# Patient Record
Sex: Female | Born: 1955 | Race: White | Hispanic: No | Marital: Single | Smoking: Never smoker
Health system: Southern US, Community
[De-identification: ages and names within clinical notes are randomized; demographics above are authoritative.]

## PROBLEM LIST (undated history)

## (undated) DIAGNOSIS — E119 Type 2 diabetes mellitus without complications: Secondary | ICD-10-CM

---

## 2019-08-19 ENCOUNTER — Other Ambulatory Visit: Payer: Self-pay | Admitting: Physician Assistant

## 2019-08-19 DIAGNOSIS — Z1231 Encounter for screening mammogram for malignant neoplasm of breast: Secondary | ICD-10-CM

## 2019-10-06 ENCOUNTER — Other Ambulatory Visit: Payer: Self-pay

## 2019-10-06 ENCOUNTER — Emergency Department (HOSPITAL_COMMUNITY)
Admission: EM | Admit: 2019-10-06 | Discharge: 2019-10-08 | Disposition: A | Discharge status: Other Acute Inpatient Hospital | Payer: Medicare Other | Attending: Emergency Medicine | Admitting: Emergency Medicine

## 2019-10-06 ENCOUNTER — Encounter (HOSPITAL_COMMUNITY): Payer: Self-pay

## 2019-10-06 DIAGNOSIS — F329 Major depressive disorder, single episode, unspecified: Secondary | ICD-10-CM | POA: Insufficient documentation

## 2019-10-06 DIAGNOSIS — E876 Hypokalemia: Secondary | ICD-10-CM | POA: Diagnosis not present

## 2019-10-06 DIAGNOSIS — T383X2A Poisoning by insulin and oral hypoglycemic [antidiabetic] drugs, intentional self-harm, initial encounter: Secondary | ICD-10-CM | POA: Diagnosis not present

## 2019-10-06 DIAGNOSIS — Z794 Long term (current) use of insulin: Secondary | ICD-10-CM | POA: Insufficient documentation

## 2019-10-06 DIAGNOSIS — Z599 Problem related to housing and economic circumstances, unspecified: Secondary | ICD-10-CM

## 2019-10-06 DIAGNOSIS — Z20828 Contact with and (suspected) exposure to other viral communicable diseases: Secondary | ICD-10-CM | POA: Insufficient documentation

## 2019-10-06 DIAGNOSIS — E162 Hypoglycemia, unspecified: Secondary | ICD-10-CM | POA: Diagnosis present

## 2019-10-06 DIAGNOSIS — E119 Type 2 diabetes mellitus without complications: Secondary | ICD-10-CM | POA: Insufficient documentation

## 2019-10-06 HISTORY — DX: Type 2 diabetes mellitus without complications: E11.9

## 2019-10-06 LAB — URINALYSIS, COMPLETE (UACMP) WITH MICROSCOPIC
Bilirubin Urine: NEGATIVE
Glucose, UA: NEGATIVE mg/dL
Hgb urine dipstick: NEGATIVE
Ketones, ur: NEGATIVE mg/dL
Leukocytes,Ua: NEGATIVE
Nitrite: NEGATIVE
Protein, ur: NEGATIVE mg/dL
Specific Gravity, Urine: 1.011 (ref 1.005–1.030)
pH: 5 (ref 5.0–8.0)

## 2019-10-06 LAB — CBG MONITORING, ED
Glucose-Capillary: 153 mg/dL — ABNORMAL HIGH (ref 70–99)
Glucose-Capillary: 130 mg/dL — ABNORMAL HIGH (ref 70–99)
Glucose-Capillary: 111 mg/dL — ABNORMAL HIGH (ref 70–99)
Glucose-Capillary: 132 mg/dL — ABNORMAL HIGH (ref 70–99)
Glucose-Capillary: 159 mg/dL — ABNORMAL HIGH (ref 70–99)

## 2019-10-06 LAB — BASIC METABOLIC PANEL
Anion gap: 10 (ref 5–15)
BUN: 13 mg/dL (ref 8–23)
CO2: 32 mmol/L (ref 22–32)
Calcium: 9.3 mg/dL (ref 8.9–10.3)
Chloride: 100 mmol/L (ref 98–111)
Creatinine, Ser: 0.74 mg/dL (ref 0.44–1.00)
GFR calc Af Amer: >60 mL/min (ref >60)
GFR calc non Af Amer: >60 mL/min (ref >60)
Glucose, Bld: 104 mg/dL — ABNORMAL HIGH (ref 70–99)
Potassium: 3.1 mmol/L — ABNORMAL LOW (ref 3.5–5.1)
Sodium: 142 mmol/L (ref 135–145)

## 2019-10-06 LAB — COMPREHENSIVE METABOLIC PANEL
ALT: 15 U/L (ref 0–44)
AST: 19 U/L (ref 15–41)
Albumin: 3.1 g/dL — ABNORMAL LOW (ref 3.5–5.0)
Alkaline Phosphatase: 95 U/L (ref 38–126)
Anion gap: 8 (ref 5–15)
BUN: 12 mg/dL (ref 8–23)
CO2: 28 mmol/L (ref 22–32)
Calcium: 7.8 mg/dL — ABNORMAL LOW (ref 8.9–10.3)
Chloride: 104 mmol/L (ref 98–111)
Creatinine, Ser: 0.58 mg/dL (ref 0.44–1.00)
GFR calc Af Amer: >60 mL/min (ref >60)
GFR calc non Af Amer: >60 mL/min (ref >60)
Glucose, Bld: 204 mg/dL — ABNORMAL HIGH (ref 70–99)
Potassium: 2.2 mmol/L — CL (ref 3.5–5.1)
Sodium: 140 mmol/L (ref 135–145)
Total Bilirubin: 0.3 mg/dL (ref 0.3–1.2)
Total Protein: 5.9 g/dL — ABNORMAL LOW (ref 6.5–8.1)

## 2019-10-06 LAB — CBC
HCT: 42.2 % (ref 36.0–46.0)
Hemoglobin: 13.4 g/dL (ref 12.0–15.0)
MCH: 29.6 pg (ref 26.0–34.0)
MCHC: 31.8 g/dL (ref 30.0–36.0)
MCV: 93.2 fL (ref 80.0–100.0)
Platelets: 186 10*3/uL (ref 150–400)
RBC: 4.53 MIL/uL (ref 3.87–5.11)
RDW: 14.7 % (ref 11.5–15.5)
WBC: 13.4 10*3/uL — ABNORMAL HIGH (ref 4.0–10.5)
nRBC: 0 % (ref 0.0–0.2)

## 2019-10-06 LAB — ETHANOL: Alcohol, Ethyl (B): <10 mg/dL (ref <10)

## 2019-10-06 LAB — SALICYLATE LEVEL: Salicylate Lvl: <7 mg/dL (ref 2.8–30.0)

## 2019-10-06 LAB — SARS CORONAVIRUS 2 (TAT 6-24 HRS): SARS Coronavirus 2: NEGATIVE

## 2019-10-06 LAB — ACETAMINOPHEN LEVEL: Acetaminophen (Tylenol), Serum: <10 ug/mL — ABNORMAL LOW (ref 10–30)

## 2019-10-06 LAB — MAGNESIUM: Magnesium: 2.1 mg/dL (ref 1.7–2.4)

## 2019-10-06 MED ORDER — SODIUM CHLORIDE 0.9 % IV SOLN
INTRAVENOUS | Status: DC | PRN
  Administered 2019-10-06: 18:00:00 500 mL via INTRAVENOUS

## 2019-10-06 MED ORDER — SODIUM CHLORIDE 0.9 % IV SOLN: 250.0000 mL | INTRAVENOUS | Status: DC | PRN

## 2019-10-06 MED ORDER — SODIUM CHLORIDE 0.9% FLUSH: 3.0000 mL | INTRAVENOUS | Status: DC | PRN

## 2019-10-06 MED ORDER — POTASSIUM CHLORIDE CRYS ER 20 MEQ PO TBCR
40.0000 meq | EXTENDED_RELEASE_TABLET | Freq: Once | ORAL | Status: AC
  Administered 2019-10-06: 40 meq via ORAL
  Filled 2019-10-06: qty 2

## 2019-10-06 MED ORDER — POTASSIUM CHLORIDE 10 MEQ/100ML IV SOLN
10.0000 meq | INTRAVENOUS | Status: DC
  Administered 2019-10-06: 10 meq via INTRAVENOUS
  Filled 2019-10-06: qty 100

## 2019-10-06 MED ORDER — SODIUM CHLORIDE 0.9% FLUSH: 3.0000 mL | Freq: Two times a day (BID) | INTRAVENOUS | Status: DC

## 2019-10-06 MED ORDER — KETOROLAC TROMETHAMINE 60 MG/2ML IM SOLN
60.0000 mg | Freq: Once | INTRAMUSCULAR | Status: AC
  Administered 2019-10-06: 60 mg via INTRAMUSCULAR
  Filled 2019-10-06: qty 2

## 2019-10-06 MED ORDER — ACETAMINOPHEN 325 MG PO TABS
650.0000 mg | ORAL_TABLET | ORAL | Status: DC | PRN
  Administered 2019-10-06: 650 mg via ORAL
  Filled 2019-10-06: qty 2

## 2019-10-06 NOTE — BH Assessment (Addendum)
Tele Assessment Note   Patient Name: Elizabeth Holt MRN: [id number] Referring Physician: Dr. Pricilla Loveless Location of Patient: [place] Location of Provider: Behavioral Health TTS Department  Elizabeth Holt is an 63 y.o. female presenting with SI with attempted overdose on insulin. EMS reports from home, roommate called for intentional excessive use of insulin, Patient states she took 10units, 10units and 15units of Humolog between 1000 and 1200 today. Patient also states she took it intentionally due to stress in the living environment of her home, especially a 42 year old roommate who is supposed to be helping her but instead plays extremely loud music and doesn't answer her at times. Patient also reported stressors related to her medical condition of always needing help. Patient states that due to these noises she has been feeling worse. Patient was unable to recall if she received any psychiatric medication management treatment. Patient denied prior suicide attempts and self-harming behaviors. Patient denies HI, psychosis and alcohol/drug usage.   Patient resides with son-in-law and another roommate of the son-in-law. Patient reported having a son that lives in [place] and a daughter that lives in [place]. Patient is receiving disability. Patient was pleasant and cooperative during assessment.  Collateral Contact: Elizabeth Holt, son-in-law, [phone], was present during assessment. Elizabeth Holt was unaware of patient SI. Elizabeth Holt reported patient can be inpatient at times. Elizabeth Holt felt that additional inpatient treatment may be needed for patient. Elizabeth Holt shared no other concerns at this time.  Diagnosis: Major depression disorder  Past Medical History:  Past Medical History:  Diagnosis Date  . Diabetes mellitus without complication (HCC)     History reviewed. No pertinent surgical history.  Family History: History reviewed. No pertinent family history.  Social History:  reports that she has never  smoked. She has never used smokeless tobacco. No history on file for alcohol and drug.  Additional Social History:  Alcohol / Drug Use Pain Medications: see MAR Prescriptions: see MAR Over the Counter: see MAR  CIWA: CIWA-Ar BP: 122/72 Pulse Rate: 87 COWS:    Allergies:  Allergies  Allergen Reactions  . Lyrica [Pregabalin]   . Robaxin [Methocarbamol]     Home Medications: (Not in a hospital admission)   OB/GYN Status:  No LMP recorded. Patient is postmenopausal.  General Assessment Data Location of Assessment: [facility] TTS Assessment: In system Is this a Tele or Face-to-Face Assessment?: Tele Assessment Is this an Initial Assessment or a Re-assessment for this encounter?: Initial Assessment Patient Accompanied by:: Other(son in law) Language Other than English: No Living Arrangements: (family home) What gender do you identify as?: Female Marital status: Single Living Arrangements: Other relatives(son in law) Can pt return to current living arrangement?: Yes Admission Status: Voluntary Is patient capable of signing voluntary admission?: Yes Referral Source: Self/Family/Friend     Crisis Care Plan Living Arrangements: Other relatives(son in law) Legal Guardian: (self) Name of Psychiatrist: (none) Name of Therapist: (none)  Education Status Is patient currently in school?: No Is the patient employed, unemployed or receiving disability?: Receiving disability income  Risk to self with the past 6 months Suicidal Ideation: Yes-Currently Present Has patient been a risk to self within the past 6 months prior to admission? : Yes Suicidal Intent: Yes-Currently Present Has patient had any suicidal intent within the past 6 months prior to admission? : Yes Is patient at risk for suicide?: Yes Suicidal Plan?: Yes-Currently Present Has patient had any suicidal plan within the past 6 months prior to admission? : Yes Specify Current Suicidal Plan: (attempted overdose on  insulin) Access to Means: Yes Specify Access to Suicidal Means: (insulin user) What has been your use of drugs/alcohol within the last 12 months?: (none) Previous Attempts/Gestures: No How many times?: (0) Other Self Harm Risks: (none reported) Triggers for Past Attempts: (n/a) Intentional Self Injurious Behavior: None Family Suicide History: No Recent stressful life event(s): (diabetes) Persecutory voices/beliefs?: No Depression: Yes Depression Symptoms: Feeling worthless/self pity, Loss of interest in usual pleasures, Guilt, Fatigue, Isolating, Tearfulness, Feeling angry/irritable Substance abuse history and/or treatment for substance abuse?: No Suicide prevention information given to non-admitted patients: Not applicable  Risk to Others within the past 6 months Homicidal Ideation: No Does patient have any lifetime risk of violence toward others beyond the six months prior to admission? : No Thoughts of Harm to Others: No Current Homicidal Intent: No Current Homicidal Plan: No Access to Homicidal Means: No Identified Victim: (n/a) History of harm to others?: No Assessment of Violence: None Noted Violent Behavior Description: (none reported) Does patient have access to weapons?: No Criminal Charges Pending?: No Does patient have a court date: No Is patient on probation?: No  Psychosis Hallucinations: None noted Delusions: None noted  Mental Status Report Appearance/Hygiene: Unremarkable Eye Contact: Fair Motor Activity: Freedom of movement Speech: Logical/coherent Level of Consciousness: Alert Mood: Depressed Affect: Depressed, Appropriate to circumstance Anxiety Level: Moderate Thought Processes: Coherent, Relevant Judgement: Partial Orientation: Person, Place, Time, Situation Obsessive Compulsive Thoughts/Behaviors: None  Cognitive Functioning Concentration: Good Memory: Recent Intact Is patient IDD: No Insight: Poor Impulse Control: Poor Appetite:  Fair Have you had any weight changes? : No Change Sleep: No Change Total Hours of Sleep: (7-9 hours) Vegetative Symptoms: None  ADLScreening [facility] Assessment Services) Patient's cognitive ability adequate to safely complete daily activities?: Yes Patient able to express need for assistance with ADLs?: Yes Independently performs ADLs?: Yes (appropriate for developmental age)  Prior Inpatient Therapy Prior Inpatient Therapy: No  Prior Outpatient Therapy Prior Outpatient Therapy: No Does patient have an ACCT team?: No Does patient have Intensive In-House Services?  : No Does patient have [facility] services? : No Does patient have P4CC services?: No  ADL Screening (condition at time of admission) Patient's cognitive ability adequate to safely complete daily activities?: Yes Patient able to express need for assistance with ADLs?: Yes Independently performs ADLs?: Yes (appropriate for developmental age)  Regulatory affairs officer (For Healthcare) Does Patient Have a Medical Advance Directive?: No Would patient like information on creating a medical advance directive?: No - Patient declined   Disposition:  Disposition Initial Assessment Completed for this Encounter: Yes  Renetta Chalk, NP, patient meets inpatient criteria. TTS to secure placement. Inpatient geriatric psychiatry recommended.  This service was provided via telemedicine using a 2-way, interactive audio and video technology.  Names of all persons participating in this telemedicine service and their role in this encounter. Name: Elizabeth Holt Role: Patient  Name: Kirtland Bouchard Role: TTS Clinician  Name:  Role:   Name:  Role:     Venora Maples 10/06/2019 8:46 PM

## 2019-10-06 NOTE — ED Notes (Signed)
Pt removed her IV. When asked why she removed it, pt stated "I didn't want it anymore".

## 2019-10-06 NOTE — ED Notes (Signed)
Pt provided 16oz orange juice and sandwich

## 2019-10-06 NOTE — ED Notes (Signed)
Pt given turkey sandwich and orange juice

## 2019-10-06 NOTE — Care Management (Signed)
Under review [place], [facility], [place], [facility], [place], [facility], [facility], [facility], [facility], [facility], [place], [facility] [place], [place], [place], [place], [place], [place], [facility], [facility], [facility], [facility], [facility], [facility], [facility], [facility], [facility], [facility], [facility], [place], [place], [place], [place], [place], [facility], [place], [place] 

## 2019-10-06 NOTE — ED Provider Notes (Signed)
[facility]-EMERGENCY DEPT Provider Note   CSN: [id number] Arrival date & time: 10/06/19  1526     History   Chief Complaint Chief Complaint  Patient presents with  . Ingestion  . Hypoglycemia    HPI Elizabeth Holt is a 63 y.o. female.     HPI  63 year old female presents with intentional insulin between 10 AM and 12 PM she took 10 units, 10 units, and 15 units of Humalog.  Roommate called EMS and her initial CBG was 60 and was given oral glucagon and D10.  She states that she is had a lot of stressors in her house, especially a 61 year old who is supposed to be helping her but instead place extremely loud music.  Patient states that due to these noise she has been feeling worse.  She denies any acute medical complaints otherwise.  She denies feeling suicidal now but does acknowledge she is depressed.  Past Medical History:  Diagnosis Date  . Diabetes mellitus without complication (HCC)     There are no active problems to display for this patient.   History reviewed. No pertinent surgical history.   OB History   No obstetric history on file.      Home Medications    Prior to Admission medications   Not on File    Family History History reviewed. No pertinent family history.  Social History Social History   Tobacco Use  . Smoking status: Never Smoker  . Smokeless tobacco: Never Used  Substance Use Topics  . Alcohol use: Not on file  . Drug use: Not on file     Allergies   Lyrica [pregabalin] and Robaxin [methocarbamol]   Review of Systems Review of Systems  Constitutional: Negative for fever.  Respiratory: Negative for shortness of breath.   Cardiovascular: Negative for chest pain.  Gastrointestinal: Negative for abdominal pain and vomiting.  Neurological: Negative for headaches.  Psychiatric/Behavioral: Positive for dysphoric mood. Negative for hallucinations.  All other systems reviewed and are negative.    Physical  Exam Updated Vital Signs BP (!) 123/58   Pulse 76   Temp 97.9 F (36.6 C) (Oral)   Resp 19   Ht 5\' 2"  (1.575 m)   Wt 64.4 kg   SpO2 95%   BMI 25.97 kg/m   Physical Exam Vitals signs and nursing note reviewed.  Constitutional:      General: She is not in acute distress.    Appearance: She is well-developed. She is not ill-appearing or diaphoretic.  HENT:     Head: Normocephalic and atraumatic.     Right Ear: External ear normal.     Left Ear: External ear normal.     Nose: Nose normal.  Eyes:     General:        Right eye: No discharge.        Left eye: No discharge.  Cardiovascular:     Rate and Rhythm: Normal rate and regular rhythm.     Heart sounds: Normal heart sounds.  Pulmonary:     Effort: Pulmonary effort is normal.     Breath sounds: Normal breath sounds.  Abdominal:     Palpations: Abdomen is soft.     Tenderness: There is no abdominal tenderness.  Skin:    General: Skin is warm and dry.  Neurological:     Mental Status: She is alert.  Psychiatric:        Mood and Affect: Mood is depressed. Mood is not anxious.  Thought Content: Thought content does not include suicidal ideation.      ED Treatments / Results  Labs (all labs ordered are listed, but only abnormal results are displayed) Labs Reviewed  CBC - Abnormal; Notable for the following components:      Result Value   WBC 13.4 (*)    All other components within normal limits  URINALYSIS, COMPLETE (UACMP) WITH MICROSCOPIC - Abnormal; Notable for the following components:   Bacteria, UA RARE (*)    All other components within normal limits  ACETAMINOPHEN LEVEL - Abnormal; Notable for the following components:   Acetaminophen (Tylenol), Serum <10 (*)    All other components within normal limits  COMPREHENSIVE METABOLIC PANEL - Abnormal; Notable for the following components:   Potassium 2.2 (*)    Glucose, Bld 204 (*)    Calcium 7.8 (*)    Total Protein 5.9 (*)    Albumin 3.1 (*)     All other components within normal limits  BASIC METABOLIC PANEL - Abnormal; Notable for the following components:   Potassium 3.1 (*)    Glucose, Bld 104 (*)    All other components within normal limits  CBG MONITORING, ED - Abnormal; Notable for the following components:   Glucose-Capillary 153 (*)    All other components within normal limits  CBG MONITORING, ED - Abnormal; Notable for the following components:   Glucose-Capillary 130 (*)    All other components within normal limits  CBG MONITORING, ED - Abnormal; Notable for the following components:   Glucose-Capillary 111 (*)    All other components within normal limits  CBG MONITORING, ED - Abnormal; Notable for the following components:   Glucose-Capillary 132 (*)    All other components within normal limits  SARS CORONAVIRUS 2 (TAT 6-24 HRS)  ETHANOL  SALICYLATE LEVEL  MAGNESIUM  BASIC METABOLIC PANEL    EKG EKG Interpretation  Date/Time:  Wednesday October 06 2019 16:08:44 EDT Ventricular Rate:  94 PR Interval:    QRS Duration: 95 QT Interval:  392 QTC Calculation: 491 R Axis:   9 Text Interpretation:  Sinus rhythm Borderline prolonged QT interval No old tracing to compare Confirmed by Sherwood Gambler [id number]) on 10/06/2019 4:31:59 PM   Radiology No results found.  Procedures Procedures (including critical care time)  Medications Ordered in ED Medications  sodium chloride flush (NS) 0.9 % injection 3 mL (3 mLs Intravenous Not Given 10/06/19 2101)  sodium chloride flush (NS) 0.9 % injection 3 mL (has no administration in time range)  0.9 %  sodium chloride infusion (has no administration in time range)  0.9 %  sodium chloride infusion ( Intravenous Stopped 10/06/19 1837)  acetaminophen (TYLENOL) tablet 650 mg (650 mg Oral Given 10/06/19 2203)  potassium chloride SA (KLOR-CON) CR tablet 40 mEq (40 mEq Oral Given 10/06/19 1802)     Initial Impression / Assessment and Plan / ED Course  I have reviewed the  triage vital signs and the nursing notes.  Pertinent labs & imaging results that were available during my care of the patient were reviewed by me and considered in my medical decision making (see chart for details).        Patient is hemodynamically stable.  She has had stable glucoses in the emergency department.  I discussed with poison control, recommend observation for about 5-6 hours after the last dose of Humalog.  This will be about 6 PM.  Since then, her glucose has remained stable.  Initially her potassium was  quite low at 2.2 though her blood work was drawn off of the IV.  This was rechecked with a straight stick and her potassium is 3.1.  I doubt this is related to replacement therapy as well I had ordered it, it did not have enough time to get that much indoor.  I think likely she has some mild hypokalemia.  We can recheck this in the morning, but otherwise she appears medically stable for psychiatric admission.  I have filled out the first opinion for her IVC filled out by police.  Final Clinical Impressions(s) / ED Diagnoses   Final diagnoses:  Insulin overdose, intentional self-harm, initial encounter [facility](HCC)  Hypokalemia    ED Discharge Orders    None       Pricilla LovelessGoldston, Infant Zink, MD 10/06/19 2209

## 2019-10-06 NOTE — Care Management (Signed)
Under review [place], [facility], [place], [employer], [place], [place], [place], [place], [place], [place], [place], [employer], [place], [place], [place], [place], [place], Teacher, music, [place], [place], [place], [place], [place], [place], [employer], [place], [employer], [place], [place], [place], [facility], [place], [employer], [place], [place]

## 2019-10-06 NOTE — ED Notes (Signed)
Date and time results received: 10/06/19 5:33 PM  (use smartphrase ".now" to insert current time)  Test: Potassium Critical Value: 2.2  Name of Provider Notified: Regenia Skeeter  Orders Received? Or Actions Taken?: awaiting orders

## 2019-10-06 NOTE — ED Triage Notes (Signed)
EMS reports from home, roommate called for intentional excessive use of insulin, Pt states she took 10units, 10units and 15units of Humolog between 1000 and 1200 today. CBG 60 on scene, given 15gms oral glucagon and 25gms of D-10 enroute.Pt also states she took it intentionally due to stress in the living environment of her home. 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  BP 130/70 HR 120 RR 20 Sp02 99 RA CBG 273  20ga RAC

## 2019-10-07 ENCOUNTER — Emergency Department (HOSPITAL_COMMUNITY): Payer: Medicare Other

## 2019-10-07 LAB — RAPID URINE DRUG SCREEN, HOSP PERFORMED
Amphetamines: NOT DETECTED
Barbiturates: NOT DETECTED
Benzodiazepines: NOT DETECTED
Cocaine: NOT DETECTED
Opiates: NOT DETECTED
Tetrahydrocannabinol: NOT DETECTED

## 2019-10-07 LAB — CBG MONITORING, ED
Glucose-Capillary: 104 mg/dL — ABNORMAL HIGH (ref 70–99)
Glucose-Capillary: 127 mg/dL — ABNORMAL HIGH (ref 70–99)
Glucose-Capillary: 138 mg/dL — ABNORMAL HIGH (ref 70–99)

## 2019-10-07 NOTE — BH Assessment (Signed)
[facility] Assessment Progress Note  Per Letitia Libra, FNP this pt requires psychiatric hospitalization at a facility providing specialty geriatric care at this time.  Pt presents under IVC initiated by law enforcement, which EDP Sherwood Gambler, MD has upheld.  At 11:30 Amy calls from [facility] to report that pt has been accepted to their facility by Dr Sheral Apley; they will be ready to receive pt after 16:00.  Otila Kluver concurs with this decision.  Pt's nurse, Lisette Grinder, has been notified, and agrees to call report to [phone].  Pt is to be transported via [facility].  [place] Coordinator [phone]

## 2019-10-07 NOTE — ED Notes (Signed)
Report called to [place] at [facility]

## 2019-10-07 NOTE — ED Notes (Signed)
Will transport in the AM per sheriff dept.

## 2019-10-07 NOTE — ED Notes (Signed)
Pt yelling from room saying she was getting up and leaving. This Probation officer found pt trying to get up out of bed and fully dressed. Pt was informed that she is not allowed to leave and assistance was requested. Lanelle Bal, RN came into room and assisted this Probation officer in getting back into a gown. Pt told writer to cut her shirt off, but refused take her pants off. Pt's pants were removed and Probation officer and RN attempted to get pt back in bed. Pt picked her feet up while holding her steady and pt was carefully assisted to sitting on the floor. More assistance was requested and pt was lifted back into the bed. All belongings were placed in a belonging's back and labeled. Items were removed from the room. Anderson Malta, Agricultural consultant notified. Safety mittens were placed on pt to ensure her safety.

## 2019-10-07 NOTE — ED Notes (Addendum)
Spoke to [employer] from [employer]. Magda Paganini recommends pt restart her usual insulin regimen as enough time has passed since pt's attempted insulin overdose.

## 2019-10-07 NOTE — ED Notes (Signed)
Message left for sheriff for transport

## 2019-10-08 LAB — CBG MONITORING, ED
Glucose-Capillary: 107 mg/dL — ABNORMAL HIGH (ref 70–99)
Glucose-Capillary: 121 mg/dL — ABNORMAL HIGH (ref 70–99)

## 2019-10-08 MED ORDER — LORAZEPAM 1 MG PO TABS
1.0000 mg | ORAL_TABLET | Freq: Once | ORAL | Status: AC
  Administered 2019-10-08: 1 mg via ORAL
  Filled 2019-10-08: qty 1

## 2019-10-08 NOTE — Progress Notes (Signed)
10/08/2019  [facility] to give a follow up report. Report given to [facility].

## 2019-10-08 NOTE — Progress Notes (Signed)
10/08/2019  [facility] for transport. Left message on voicemail [phone].

## 2019-11-23 ENCOUNTER — Other Ambulatory Visit: Payer: Self-pay

## 2019-11-23 ENCOUNTER — Emergency Department (HOSPITAL_COMMUNITY)
Admission: EM | Admit: 2019-11-23 | Discharge: 2019-11-23 | Disposition: A | Discharge status: Home / Self Care | Payer: Medicare Other | Attending: Emergency Medicine | Admitting: Emergency Medicine

## 2019-11-23 ENCOUNTER — Ambulatory Visit (HOSPITAL_COMMUNITY): Payer: Medicare Other

## 2019-11-23 ENCOUNTER — Emergency Department (HOSPITAL_COMMUNITY): Payer: Medicare Other

## 2019-11-23 ENCOUNTER — Encounter (HOSPITAL_COMMUNITY): Payer: Self-pay | Admitting: Emergency Medicine

## 2019-11-23 DIAGNOSIS — M25552 Pain in left hip: Secondary | ICD-10-CM | POA: Insufficient documentation

## 2019-11-23 DIAGNOSIS — Z5321 Procedure and treatment not carried out due to patient leaving prior to being seen by health care provider: Secondary | ICD-10-CM | POA: Diagnosis not present

## 2019-11-23 NOTE — ED Triage Notes (Signed)
Per EMS-patient uses a waller-walked without her walker and fell-complaining of left hip pain-left side deficits from a CVA

## 2019-11-23 NOTE — ED Notes (Signed)
Called pt in waiting room at 6:03PM, no answer.

## 2020-04-26 ENCOUNTER — Emergency Department (HOSPITAL_COMMUNITY)
Admission: EM | Admit: 2020-04-26 | Discharge: 2020-04-26 | Disposition: A | Discharge status: Home / Self Care | Payer: Medicare Other | Attending: Emergency Medicine | Admitting: Emergency Medicine

## 2020-04-26 ENCOUNTER — Encounter (HOSPITAL_COMMUNITY): Payer: Self-pay | Admitting: Emergency Medicine

## 2020-04-26 ENCOUNTER — Emergency Department (HOSPITAL_COMMUNITY): Payer: Medicare Other

## 2020-04-26 ENCOUNTER — Other Ambulatory Visit: Payer: Self-pay

## 2020-04-26 DIAGNOSIS — R1032 Left lower quadrant pain: Secondary | ICD-10-CM | POA: Diagnosis not present

## 2020-04-26 DIAGNOSIS — R110 Nausea: Secondary | ICD-10-CM | POA: Diagnosis not present

## 2020-04-26 DIAGNOSIS — E119 Type 2 diabetes mellitus without complications: Secondary | ICD-10-CM | POA: Diagnosis not present

## 2020-04-26 DIAGNOSIS — R109 Unspecified abdominal pain: Secondary | ICD-10-CM | POA: Diagnosis present

## 2020-04-26 LAB — URINALYSIS, ROUTINE W REFLEX MICROSCOPIC
Bilirubin Urine: NEGATIVE
Glucose, UA: NEGATIVE mg/dL
Hgb urine dipstick: NEGATIVE
Ketones, ur: NEGATIVE mg/dL
Nitrite: NEGATIVE
Protein, ur: NEGATIVE mg/dL
Specific Gravity, Urine: 1.027 (ref 1.005–1.030)
pH: 5 (ref 5.0–8.0)

## 2020-04-26 LAB — CBC
HCT: 41.7 % (ref 36.0–46.0)
Hemoglobin: 13.2 g/dL (ref 12.0–15.0)
MCH: 30.2 pg (ref 26.0–34.0)
MCHC: 31.7 g/dL (ref 30.0–36.0)
MCV: 95.4 fL (ref 80.0–100.0)
Platelets: 146 10*3/uL — ABNORMAL LOW (ref 150–400)
RBC: 4.37 MIL/uL (ref 3.87–5.11)
RDW: 13.5 % (ref 11.5–15.5)
WBC: 8.9 10*3/uL (ref 4.0–10.5)
nRBC: 0 % (ref 0.0–0.2)

## 2020-04-26 LAB — COMPREHENSIVE METABOLIC PANEL
ALT: 20 U/L (ref 0–44)
AST: 27 U/L (ref 15–41)
Albumin: 3.4 g/dL — ABNORMAL LOW (ref 3.5–5.0)
Alkaline Phosphatase: 106 U/L (ref 38–126)
Anion gap: 10 (ref 5–15)
BUN: 12 mg/dL (ref 8–23)
CO2: 28 mmol/L (ref 22–32)
Calcium: 9.1 mg/dL (ref 8.9–10.3)
Chloride: 101 mmol/L (ref 98–111)
Creatinine, Ser: 0.74 mg/dL (ref 0.44–1.00)
GFR calc Af Amer: >60 mL/min (ref >60)
GFR calc non Af Amer: >60 mL/min (ref >60)
Glucose, Bld: 273 mg/dL — ABNORMAL HIGH (ref 70–99)
Potassium: 3.5 mmol/L (ref 3.5–5.1)
Sodium: 139 mmol/L (ref 135–145)
Total Bilirubin: 0.5 mg/dL (ref 0.3–1.2)
Total Protein: 6.3 g/dL — ABNORMAL LOW (ref 6.5–8.1)

## 2020-04-26 LAB — LIPASE, BLOOD: Lipase: 30 U/L (ref 11–51)

## 2020-04-26 LAB — TROPONIN I (HIGH SENSITIVITY): Troponin I (High Sensitivity): 3 ng/L (ref <18)

## 2020-04-26 MED ORDER — ONDANSETRON HCL 4 MG PO TABS: 4.0000 mg | ORAL_TABLET | Freq: Four times a day (QID) | ORAL | 0 refills | Status: AC

## 2020-04-26 MED ORDER — ONDANSETRON HCL 4 MG/2ML IJ SOLN
4.0000 mg | Freq: Once | INTRAMUSCULAR | Status: AC
  Administered 2020-04-26: 4 mg via INTRAVENOUS
  Filled 2020-04-26: qty 2

## 2020-04-26 MED ORDER — ONDANSETRON 4 MG PO TBDP
4.0000 mg | ORAL_TABLET | Freq: Once | ORAL | Status: AC | PRN
  Administered 2020-04-26: 4 mg via ORAL
  Filled 2020-04-26: qty 1

## 2020-04-26 MED ORDER — DICYCLOMINE HCL 10 MG PO CAPS
10.0000 mg | ORAL_CAPSULE | Freq: Once | ORAL | Status: AC
  Administered 2020-04-26: 10 mg via ORAL
  Filled 2020-04-26: qty 1

## 2020-04-26 MED ORDER — IOHEXOL 300 MG/ML  SOLN
100.0000 mL | Freq: Once | INTRAMUSCULAR | Status: AC | PRN
  Administered 2020-04-26: 100 mL via INTRAVENOUS

## 2020-04-26 NOTE — ED Provider Notes (Signed)
Emergency Department Provider Note   I have reviewed the triage vital signs and the nursing notes.   HISTORY  Chief Complaint Abdominal Pain   HPI Elizabeth Holt is a 64 y.o. female with PMH of DM presents to the emergency department for evaluation of abdominal pain with nausea starting last night.  Patient states she ate meatloaf and cabbage and shortly afterwards began to feel diffuse abdominal discomfort with nausea.  She has not had vomiting or diarrhea.  She had a normal bowel movement this morning but continued to have pain through the night.  She continues to pass flatus.  She had some burning chest discomfort last night but denies any pressure, tightness, sharp pain.  No shortness of breath.  No fevers or chills.  No blood in the bowel movements.  No similar pain in the past.  Denies any dysuria, hesitancy, urgency.  Pain is constant and moderately severe.  No radiation of symptoms or other modifying factors.   Past Medical History:  Diagnosis Date  . Diabetes mellitus without complication ([place])     There are no problems to display for this patient.   History reviewed. No pertinent surgical history.  Allergies Lyrica [pregabalin] and Robaxin [methocarbamol]  No family history on file.  Social History Social History   Tobacco Use  . Smoking status: Never Smoker  . Smokeless tobacco: Never Used  Substance Use Topics  . Alcohol use: Not on file  . Drug use: Not on file    Review of Systems  Constitutional: No fever/chills Eyes: No visual changes. ENT: No sore throat. Cardiovascular: Burning chest pain last night. Respiratory: Denies shortness of breath. Gastrointestinal: Positive abdominal pain. Positive nausea, no vomiting.  No diarrhea.  No constipation. Genitourinary: Negative for dysuria. Musculoskeletal: Negative for back pain. Skin: Negative for rash. Neurological: Negative for headaches, focal weakness or numbness.  10-point ROS otherwise  negative.  ____________________________________________   PHYSICAL EXAM:  VITAL SIGNS: ED Triage Vitals  Enc Vitals Group     BP 04/26/20 1248 (!) 145/82     Pulse Rate 04/26/20 1248 88     Resp 04/26/20 1248 20     Temp 04/26/20 1248 98.7 F (37.1 C)     Temp Source 04/26/20 1248 Oral     SpO2 04/26/20 1248 97 %     Weight 04/26/20 1249 140 lb (63.5 kg)   Constitutional: Alert and oriented. Well appearing and in no acute distress. Eyes: Conjunctivae are normal. Head: Atraumatic. Nose: No congestion/rhinnorhea. Mouth/Throat: Mucous membranes are moist. Neck: No stridor.  Cardiovascular: Normal rate, regular rhythm. Good peripheral circulation. Grossly normal heart sounds.   Respiratory: Normal respiratory effort.  No retractions. Lungs CTAB. Gastrointestinal: Soft with mild LLQ tenderness. No rebound or guarding. No tenderness in other abdominal quadrants. No distention.  Musculoskeletal: No gross deformities of extremities. Neurologic:  Normal speech and language.  Skin:  Skin is warm, dry and intact. No rash noted.   ____________________________________________   LABS (all labs ordered are listed, but only abnormal results are displayed)  Labs Reviewed  COMPREHENSIVE METABOLIC PANEL - Abnormal; Notable for the following components:      Result Value   Glucose, Bld 273 (*)    Total Protein 6.3 (*)    Albumin 3.4 (*)    All other components within normal limits  CBC - Abnormal; Notable for the following components:   Platelets 146 (*)    All other components within normal limits  URINALYSIS, ROUTINE W REFLEX MICROSCOPIC -  Abnormal; Notable for the following components:   APPearance HAZY (*)    Leukocytes,Ua SMALL (*)    Bacteria, UA RARE (*)    All other components within normal limits  LIPASE, BLOOD  TROPONIN I (HIGH SENSITIVITY)  TROPONIN I (HIGH SENSITIVITY)   ____________________________________________  EKG   EKG  Interpretation  Date/Time:  Wednesday Apr 26 2020 17:31:51 EDT Ventricular Rate:  74 PR Interval:    QRS Duration: 96 QT Interval:  390 QTC Calculation: 433 R Axis:   9 Text Interpretation: Sinus rhythm Low voltage, precordial leads No STEMI Confirmed by Alona Bene [phone]) on 04/26/2020 5:44:41 PM       ____________________________________________  RADIOLOGY  CT ABDOMEN PELVIS W CONTRAST  Result Date: 04/26/2020 CLINICAL DATA:  Left lower quadrant abdominal pain. EXAM: CT ABDOMEN AND PELVIS WITH CONTRAST TECHNIQUE: Multidetector CT imaging of the abdomen and pelvis was performed using the standard protocol following bolus administration of intravenous contrast. CONTRAST:  OMNIPAQUE IOHEXOL 300 MG/ML  SOLN COMPARISON:  November 24, 2019. FINDINGS: Lower chest: No acute abnormality. Hepatobiliary: No focal liver abnormality is seen. Status post cholecystectomy. No biliary dilatation. Pancreas: Unremarkable. No pancreatic ductal dilatation or surrounding inflammatory changes. Spleen: Normal in size without focal abnormality. Adrenals/Urinary Tract: Adrenal glands are unremarkable. Kidneys are normal, without renal calculi, focal lesion, or hydronephrosis. Bladder is unremarkable. Stomach/Bowel: Stomach is within normal limits. Appendix appears normal. No evidence of bowel wall thickening, distention, or inflammatory changes. Vascular/Lymphatic: Aortic atherosclerosis. No enlarged abdominal or pelvic lymph nodes. Reproductive: Status post hysterectomy. No adnexal masses. Other: No abdominal wall hernia or abnormality. No abdominopelvic ascites. Musculoskeletal: No acute or significant osseous findings. Old L2 compression fracture is noted. IMPRESSION: No acute abnormality seen in the abdomen or pelvis. Aortic Atherosclerosis (ICD10-I70.0). Electronically Signed   By: Lupita Raider M.D.   On: 04/26/2020 20:04    ____________________________________________   PROCEDURES  Procedure(s)  performed:   Procedures   ____________________________________________   INITIAL IMPRESSION / ASSESSMENT AND PLAN / ED COURSE  Pertinent labs & imaging results that were available during my care of the patient were reviewed by me and considered in my medical decision making (see chart for details).   Patient presents emergency department evaluation of abdominal pain with nausea.  Symptoms seem very GI in nature.  She did have some burning chest discomfort last night.  Given the patient's age and risk factors of diabetes and elevated BMI plan for troponin and EKG but overall suspicion for ACS or PE is very low.  Patient does have some mild tenderness in the left lower quadrant without rebound or guarding.  Plan for CT abdomen pelvis along with repeat dosing Zofran and Bentyl here.   09:00 PM  CT scan resulted with no acute findings.  Patient's troponin is normal.  UA with no evidence of infection.  On reassessment patient is feeling much better.  She is eating a [place] sandwich and drinking Coke and requesting that I send her the Zofran tablet rather than the ODT which was done.  Advise close PCP follow-up and discussed ED return precautions in detail.  ____________________________________________  FINAL CLINICAL IMPRESSION(S) / ED DIAGNOSES  Final diagnoses:  Left lower quadrant abdominal pain  Nausea     MEDICATIONS GIVEN DURING THIS VISIT:  Medications  ondansetron (ZOFRAN-ODT) disintegrating tablet 4 mg (4 mg Oral Given 04/26/20 1250)  ondansetron (ZOFRAN) injection 4 mg (4 mg Intravenous Given 04/26/20 1655)  dicyclomine (BENTYL) capsule 10 mg (10 mg Oral Given  04/26/20 1655)  iohexol (OMNIPAQUE) 300 MG/ML solution 100 mL (100 mLs Intravenous Contrast Given 04/26/20 1946)     NEW OUTPATIENT MEDICATIONS STARTED DURING THIS VISIT:  New Prescriptions   ONDANSETRON (ZOFRAN) 4 MG TABLET    Take 1 tablet (4 mg total) by mouth every 6 (six) hours.    Note:  This document was prepared  using Dragon voice recognition software and may include unintentional dictation errors.  Alona Bene, MD, [facility] Emergency Medicine    Saleema Weppler, Arlyss Repress, MD 04/26/20 2102

## 2020-04-26 NOTE — ED Notes (Signed)
Pt given sandwich and water for PO challenge. Pt ate sandwich and is requesting another.

## 2020-04-26 NOTE — Discharge Instructions (Signed)

## 2020-04-26 NOTE — ED Notes (Signed)
Clenton Pare called to notified pt is dc from hospital, family member on the way to pick her up to the hospital.

## 2020-04-26 NOTE — ED Notes (Signed)
Dc instructions given to the pt, pt becoming verbally abusive to this RN, pt asked several time what is wrong and how I can help her, pt continue to yell and start pushing on the bed. Dc instructions documents given to the pt and NT assisted her to get her dress and wheel her out of the ED.

## 2020-04-26 NOTE — ED Triage Notes (Signed)
Patient c/o generalized abdominal pain with nausea onset of last night. Patient tearful in triage and unable to locate exact spot of pain.

## 2020-12-21 IMAGING — CT CT ABD-PELV W/ CM
2 of 5 series · 17 of 46 positions shown, 19 images · IV contrast (omnipaque)
Comparison: November 24, 2019.

CLINICAL DATA: Left lower quadrant abdominal pain.

EXAM:
CT ABDOMEN AND PELVIS WITH CONTRAST
TECHNIQUE: Multidetector CT imaging of the abdomen and pelvis was performed
using the standard protocol following bolus administration of
intravenous contrast.
CONTRAST:  100mL OMNIPAQUE IOHEXOL 300 MG/ML  SOLN

[Series 3: a/p w/ 5mm · axial · 0.98mm/px · z∈[+867,+1262]mm · 14 of 89 slices shown, 16 images]
[im 5/89  soft-tissue]
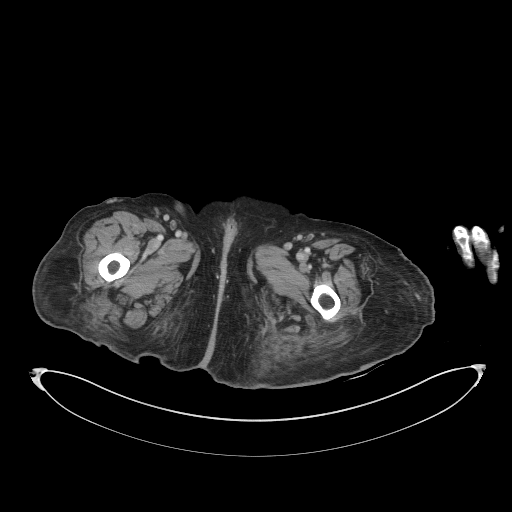
[im 5/89  bone]
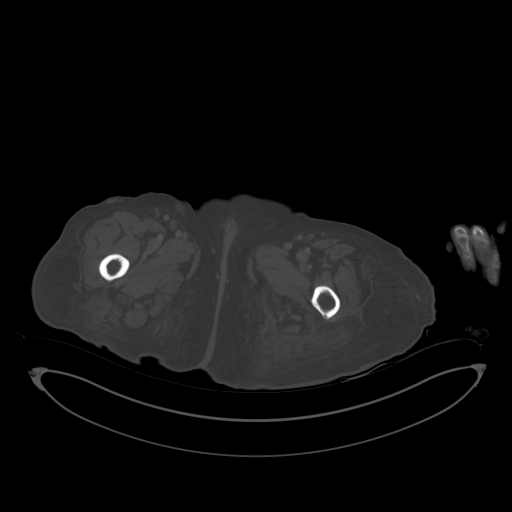
[im 10/89  soft-tissue]
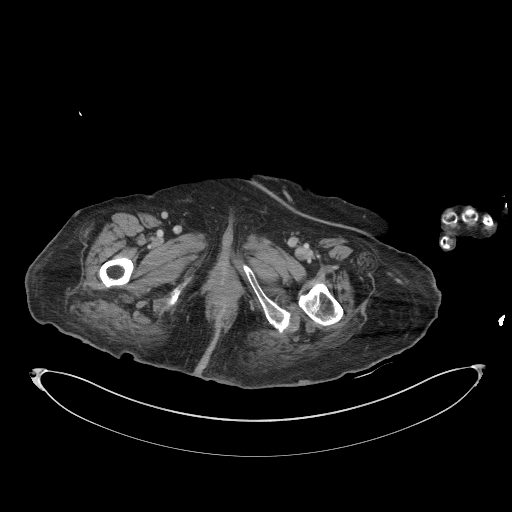
[im 19/89  soft-tissue]
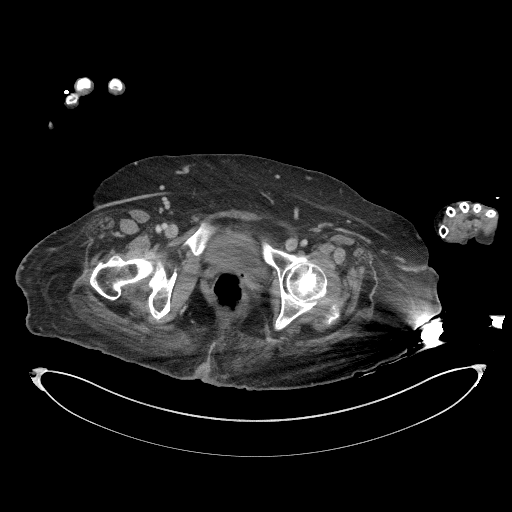
[im 24/89  soft-tissue]
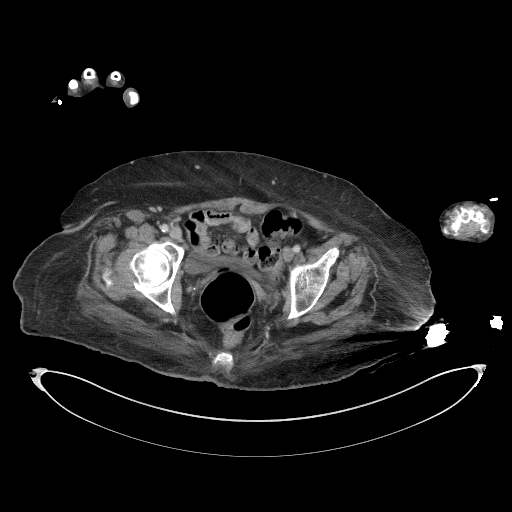
[im 28/89  soft-tissue]
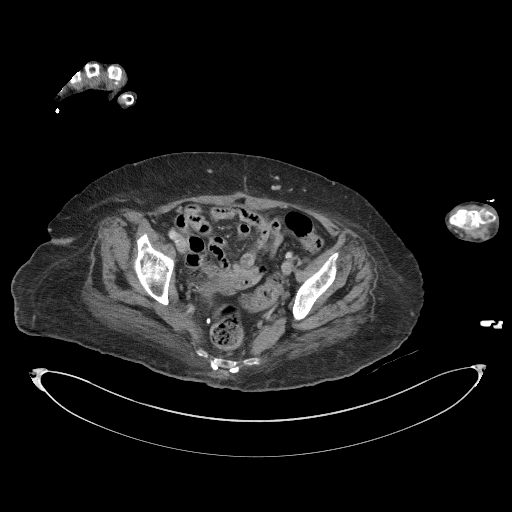
[im 38/89  soft-tissue]
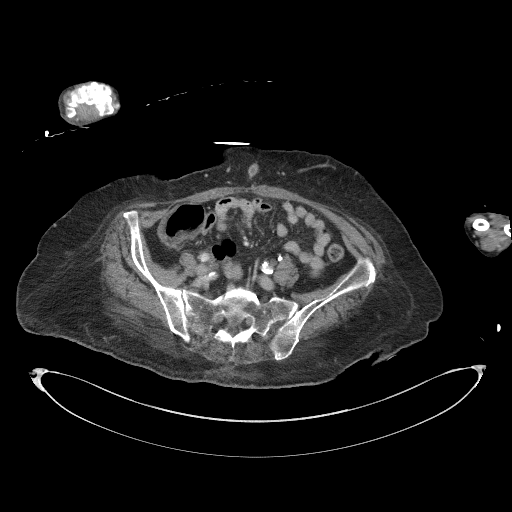
[im 42/89  soft-tissue]
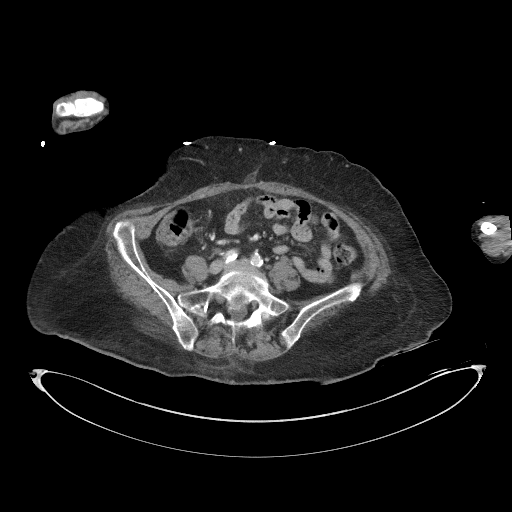
[im 47/89  soft-tissue]
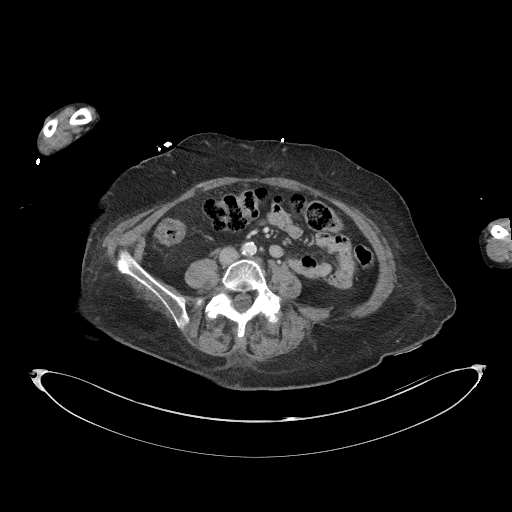
[im 51/89  soft-tissue]
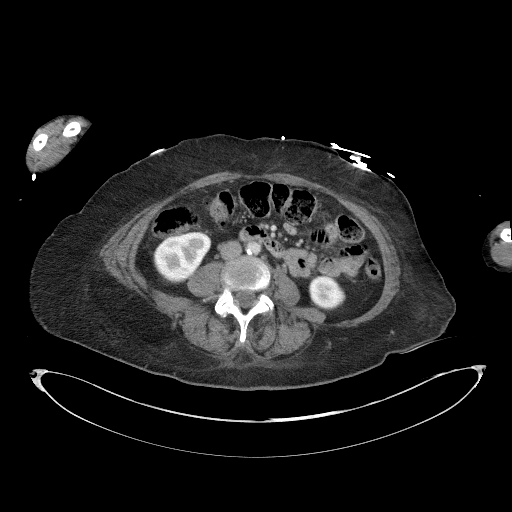
[im 51/89  bone]
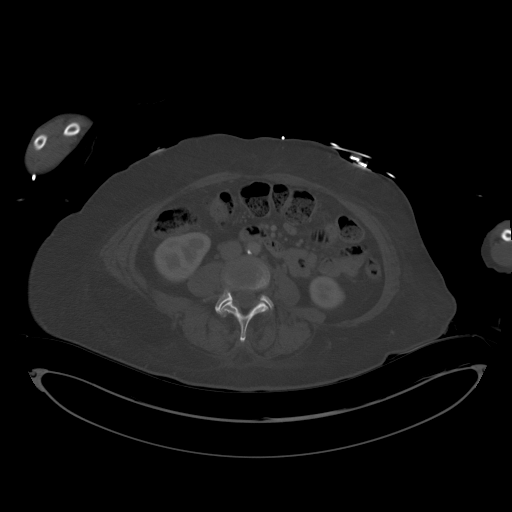
[im 61/89  soft-tissue]
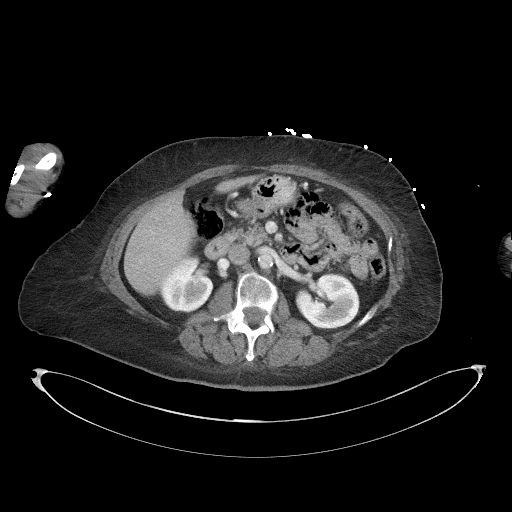
[im 65/89  soft-tissue]
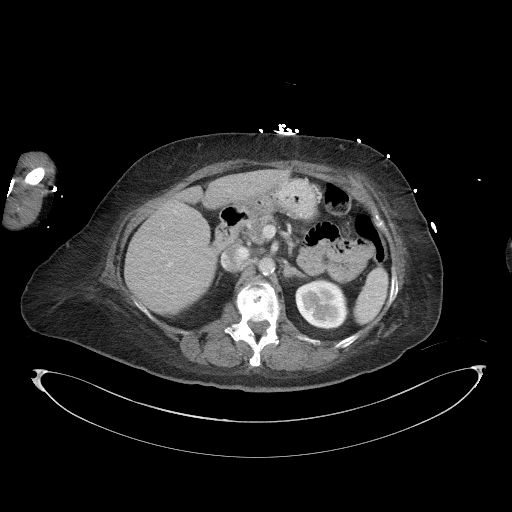
[im 70/89  soft-tissue]
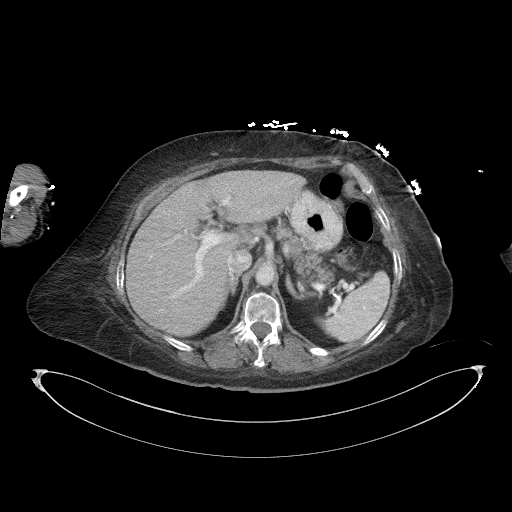
[im 79/89  soft-tissue]
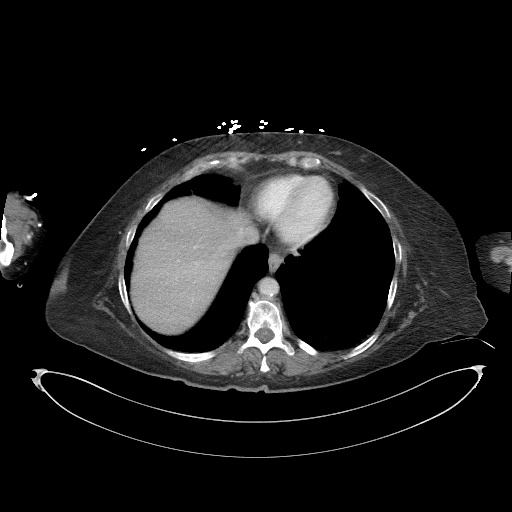
[im 84/89  soft-tissue]
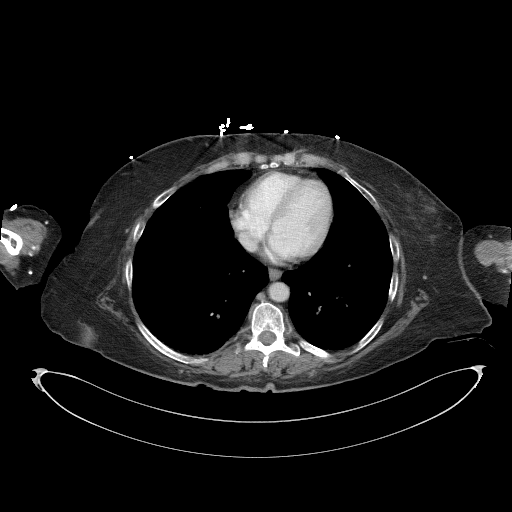

[Series 6: a/p w/ cor · coronal · 0.87mm/px · 3 of 145 slices shown]
[im 49/145  soft-tissue]
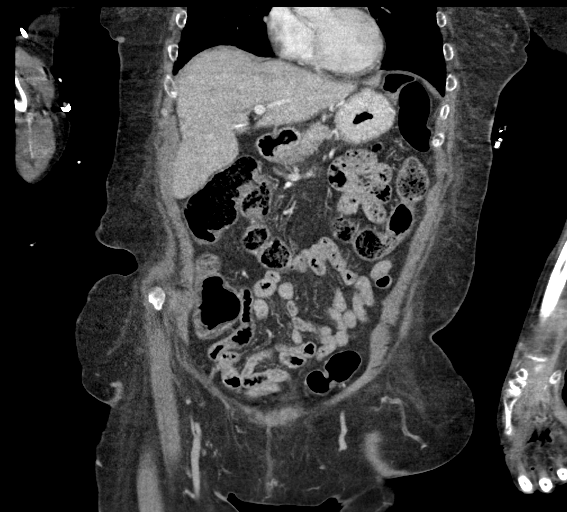
[im 65/145  soft-tissue]
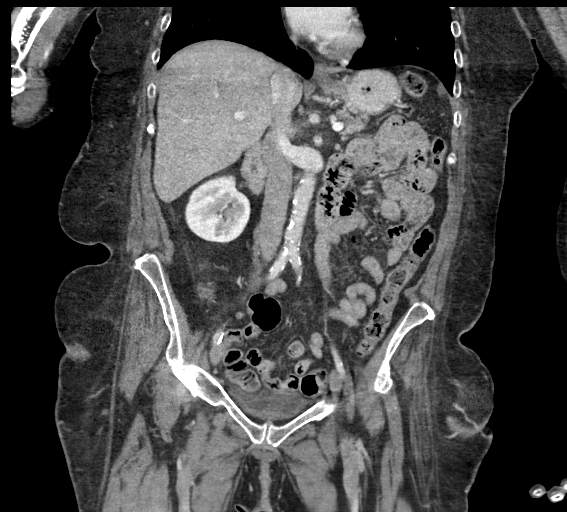
[im 81/145  soft-tissue]
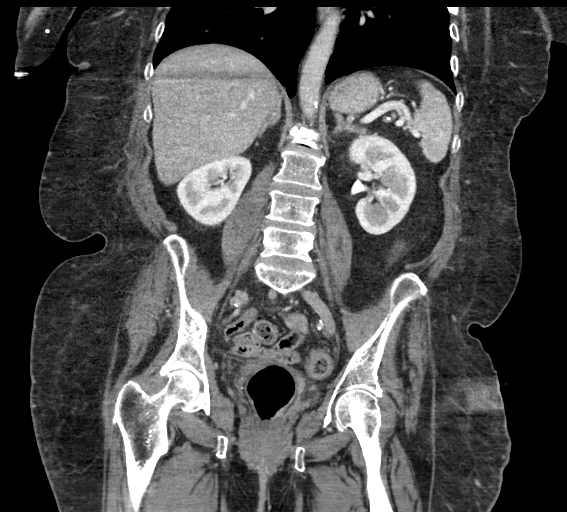

[17 of 46 positions shown; findings below may reference images not displayed]

FINDINGS: Lower chest: No acute abnormality.

Hepatobiliary: No focal liver abnormality is seen. Status post
cholecystectomy. No biliary dilatation.

Pancreas: Unremarkable. No pancreatic ductal dilatation or
surrounding inflammatory changes.

Spleen: Normal in size without focal abnormality.

Adrenals/Urinary Tract: Adrenal glands are unremarkable. Kidneys are
normal, without renal calculi, focal lesion, or hydronephrosis.
Bladder is unremarkable.

Stomach/Bowel: Stomach is within normal limits. Appendix appears
normal. No evidence of bowel wall thickening, distention, or
inflammatory changes.

Vascular/Lymphatic: Aortic atherosclerosis. No enlarged abdominal or
pelvic lymph nodes.

Reproductive: Status post hysterectomy. No adnexal masses.

Other: No abdominal wall hernia or abnormality. No abdominopelvic
ascites.

Musculoskeletal: No acute or significant osseous findings. Old L2
compression fracture is noted.
IMPRESSION: No acute abnormality seen in the abdomen or pelvis.

Aortic Atherosclerosis ([id number]).
